# Patient Record
Sex: Female | Born: 1990 | Race: White | Hispanic: No | Marital: Single
Health system: Western US, Academic
[De-identification: ages and names within clinical notes are randomized; demographics above are authoritative.]

---

## 2015-05-08 ENCOUNTER — Telehealth (INDEPENDENT_AMBULATORY_CARE_PROVIDER_SITE_OTHER): Payer: Self-pay

## 2015-05-08 NOTE — Telephone Encounter (Signed)
[facility] being [facility]    Referring Provider: Dr. Corey SkainsQuilalang    Patients PCP: Dr Corey SkainsQuilalang    Please review and advise on how to proceed with patient    Internal or External Referral External         If external referral- were referral & notes scanned YES    Authorization expiration date: n/a     Best Call Back Number: [phone]

## 2015-05-13 NOTE — Telephone Encounter (Signed)
Referral reviewed by Dr. Shih, please schedule in next available [facility].    Thank you.

## 2015-07-15 ENCOUNTER — Ambulatory Visit (HOSPITAL_BASED_OUTPATIENT_CLINIC_OR_DEPARTMENT_OTHER): Payer: MEDICAID | Admitting: Neurology

## 2021-09-17 IMAGING — CR DX Femur LT 2 View
4 series · 4 of 4 positions shown · non-contrast
Comparison: None available.

AP and lateral view of the left femur
INDICATION: Pain.

[ap (1 of 2)]
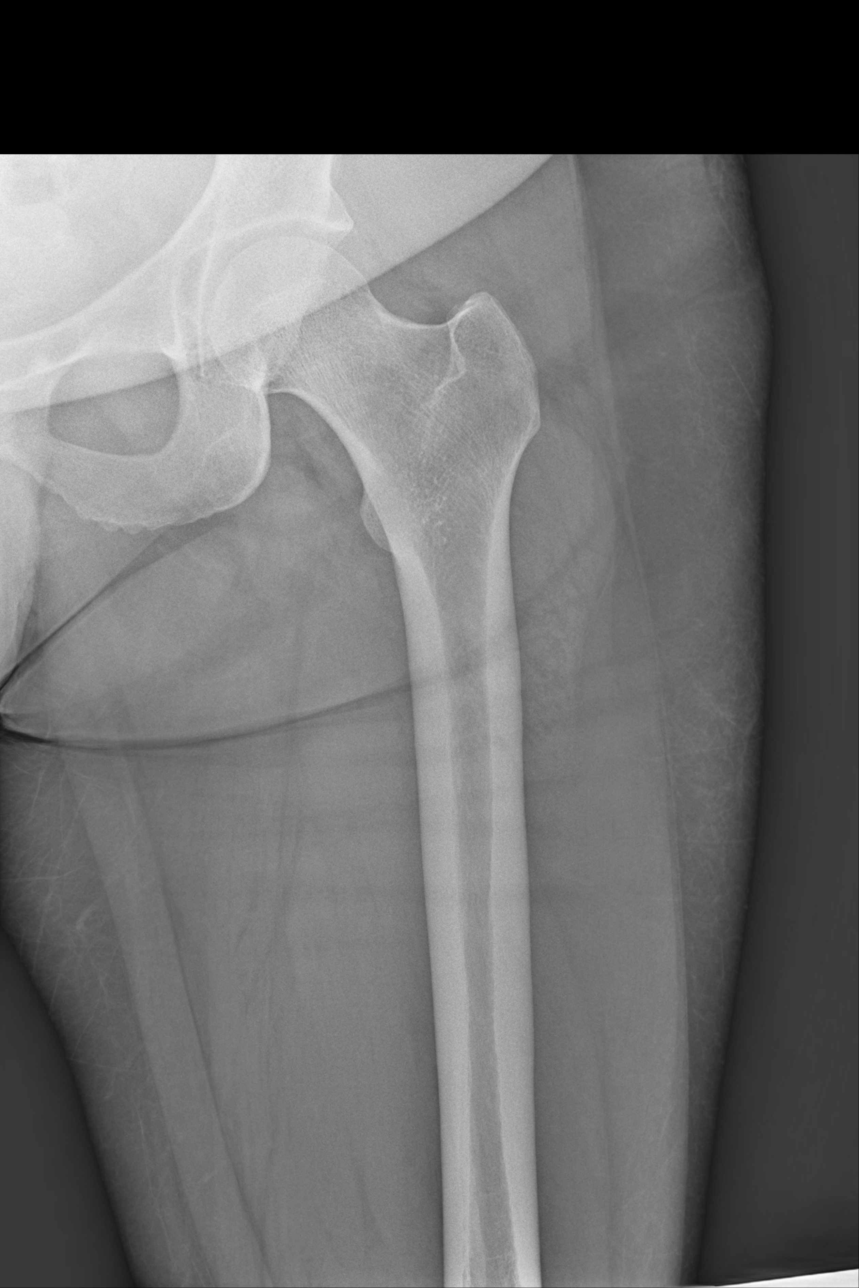

[ap (2 of 2)]
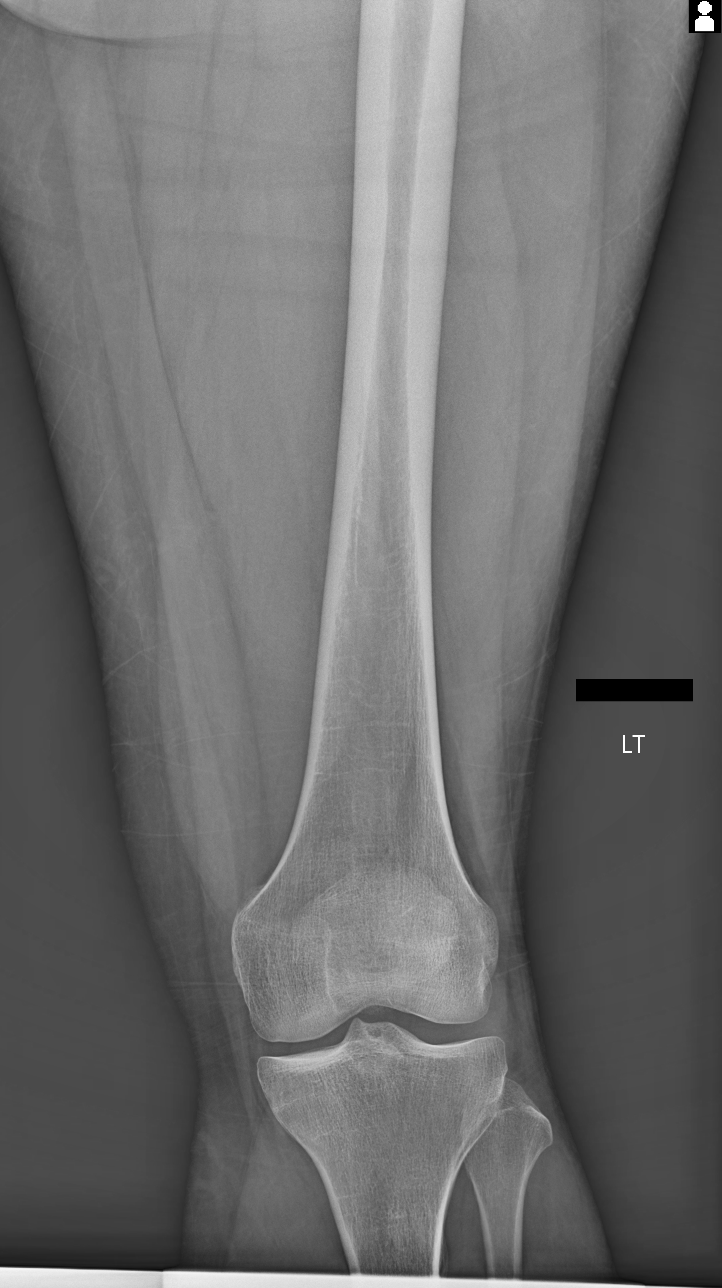

[lat (1 of 2)]
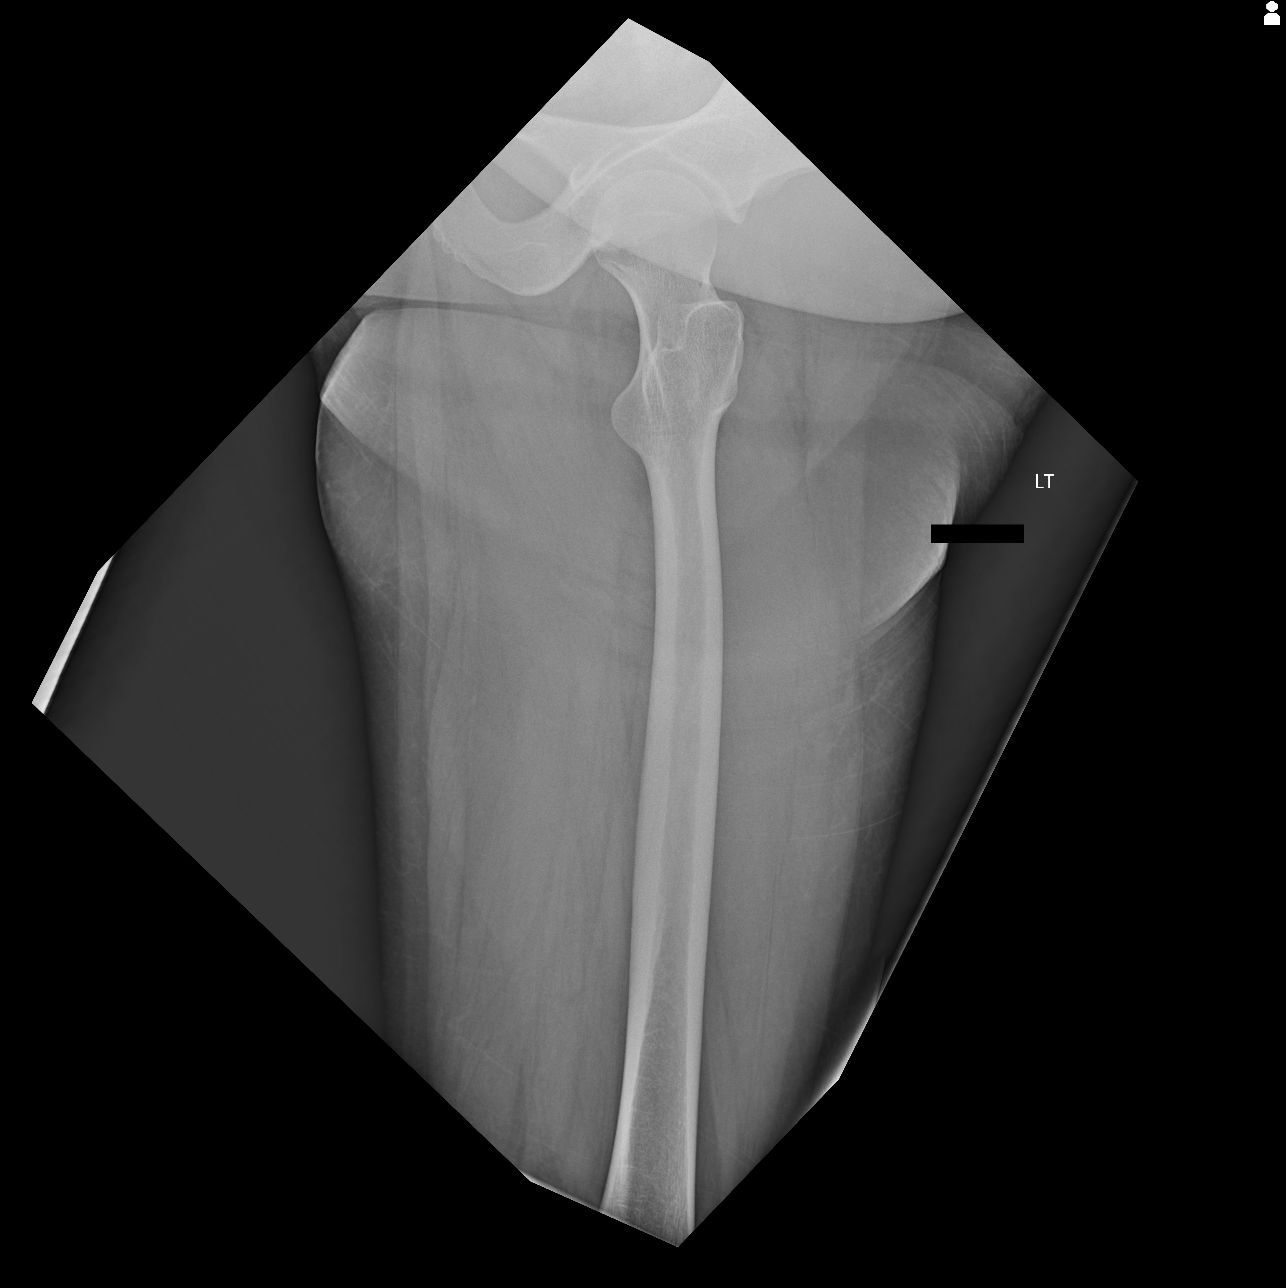

[lat (2 of 2)]
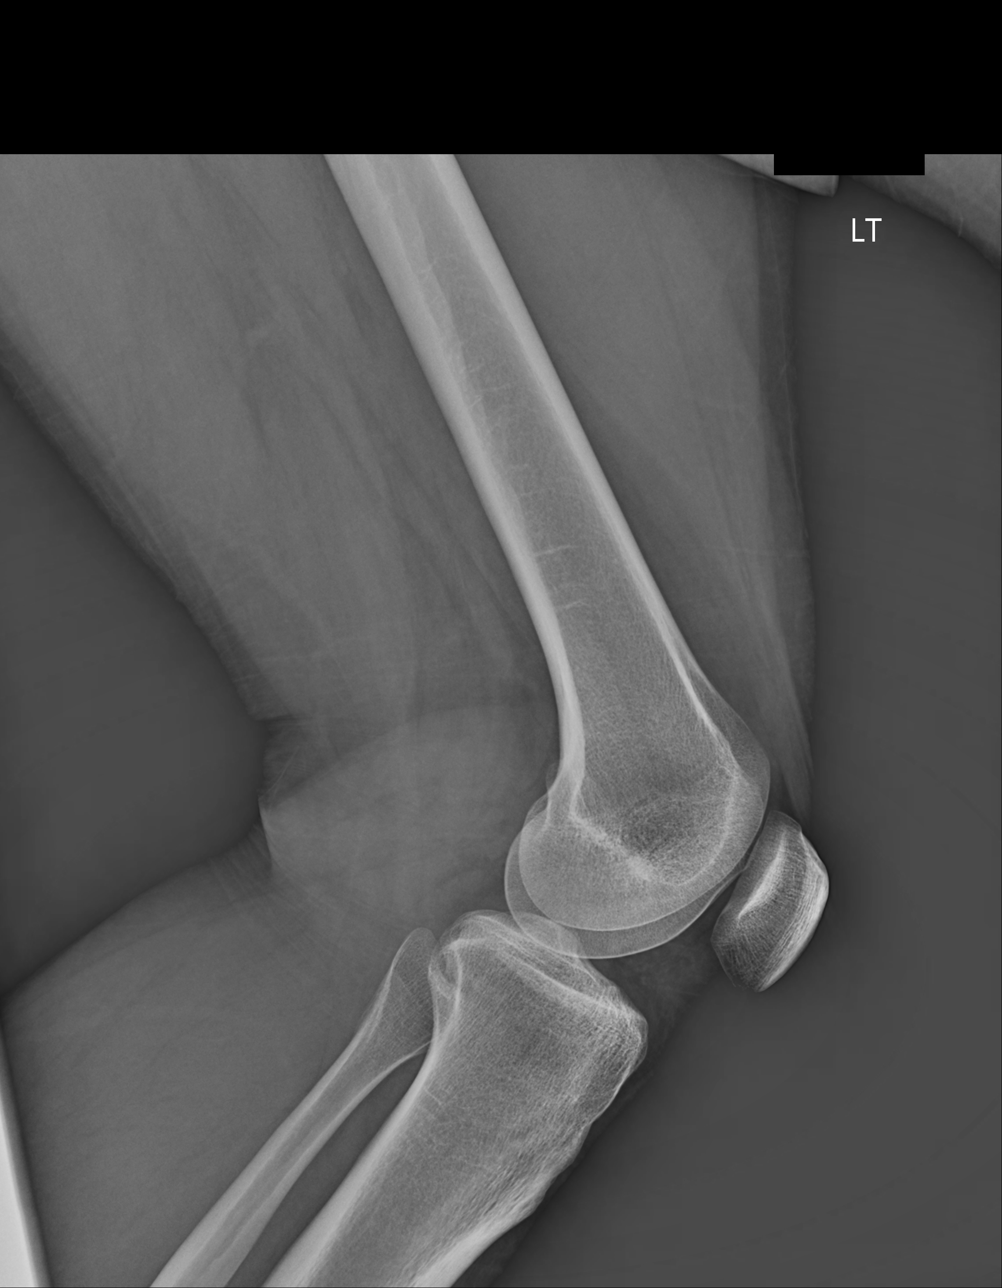

[4 of 4 positions shown; findings below may reference images not displayed]

IMPRESSION: No acute fracture or dislocation is evident. No suspicious osseous            
 lesion. No abnormal soft tissue calcifications. No radiopaque foreign body in             
 the visualized soft tissues.
